# Patient Record
Sex: Female | Born: 2000 | Race: White | Hispanic: No | Marital: Single | Smoking: Never smoker
Health system: Southern US, Community
[De-identification: ages and names within clinical notes are randomized; demographics above are authoritative.]

---

## 2018-12-17 ENCOUNTER — Emergency Department (HOSPITAL_COMMUNITY): Payer: No Typology Code available for payment source

## 2018-12-17 ENCOUNTER — Other Ambulatory Visit: Payer: Self-pay

## 2018-12-17 ENCOUNTER — Encounter (HOSPITAL_COMMUNITY): Payer: Self-pay | Admitting: Adult Health

## 2018-12-17 ENCOUNTER — Emergency Department (HOSPITAL_COMMUNITY)
Admission: EM | Admit: 2018-12-17 | Discharge: 2018-12-17 | Disposition: A | Discharge status: Home / Self Care | Payer: No Typology Code available for payment source | Attending: Emergency Medicine | Admitting: Emergency Medicine

## 2018-12-17 DIAGNOSIS — Y999 Unspecified external cause status: Secondary | ICD-10-CM | POA: Insufficient documentation

## 2018-12-17 DIAGNOSIS — Y9389 Activity, other specified: Secondary | ICD-10-CM | POA: Diagnosis not present

## 2018-12-17 DIAGNOSIS — Y92410 Unspecified street and highway as the place of occurrence of the external cause: Secondary | ICD-10-CM | POA: Insufficient documentation

## 2018-12-17 DIAGNOSIS — Z3202 Encounter for pregnancy test, result negative: Secondary | ICD-10-CM | POA: Diagnosis not present

## 2018-12-17 DIAGNOSIS — S098XXA Other specified injuries of head, initial encounter: Secondary | ICD-10-CM | POA: Diagnosis present

## 2018-12-17 DIAGNOSIS — R519 Headache, unspecified: Secondary | ICD-10-CM

## 2018-12-17 DIAGNOSIS — S0083XA Contusion of other part of head, initial encounter: Secondary | ICD-10-CM | POA: Insufficient documentation

## 2018-12-17 LAB — POC URINE PREG, ED: Preg Test, Ur: NEGATIVE

## 2018-12-17 MED ORDER — ACETAMINOPHEN 325 MG PO TABS
650.0000 mg | ORAL_TABLET | Freq: Once | ORAL | Status: AC
  Administered 2018-12-17: 650 mg via ORAL
  Filled 2018-12-17: qty 2
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
## 2018-12-17 NOTE — Discharge Instructions (Signed)

## 2019-06-04 ENCOUNTER — Other Ambulatory Visit: Payer: Self-pay

## 2019-06-04 DIAGNOSIS — Z20822 Contact with and (suspected) exposure to covid-19: Secondary | ICD-10-CM

## 2019-06-07 LAB — NOVEL CORONAVIRUS, NAA: SARS-CoV-2, NAA: NOT DETECTED
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
